# Patient Record
Sex: Female | Born: 1970
Health system: Southern US, Community
[De-identification: ages and names within clinical notes are randomized; demographics above are authoritative.]

## PROBLEM LIST (undated history)

## (undated) DIAGNOSIS — K5790 Diverticulosis of intestine, part unspecified, without perforation or abscess without bleeding: Secondary | ICD-10-CM

## (undated) DIAGNOSIS — K649 Unspecified hemorrhoids: Secondary | ICD-10-CM

## (undated) DIAGNOSIS — L719 Rosacea, unspecified: Secondary | ICD-10-CM

## (undated) DIAGNOSIS — E119 Type 2 diabetes mellitus without complications: Secondary | ICD-10-CM

## (undated) DIAGNOSIS — N200 Calculus of kidney: Secondary | ICD-10-CM

## (undated) HISTORY — DX: Calculus of kidney: N20.0

## (undated) HISTORY — PX: LITHOTRIPSY: SUR834

## (undated) HISTORY — PX: BLADDER SURGERY: SHX569

## (undated) HISTORY — DX: Type 2 diabetes mellitus without complications: E11.9

## (undated) HISTORY — DX: Diverticulosis of intestine, part unspecified, without perforation or abscess without bleeding: K57.90

## (undated) HISTORY — DX: Rosacea, unspecified: L71.9

## (undated) HISTORY — DX: Unspecified hemorrhoids: K64.9

---

## 2004-11-26 ENCOUNTER — Observation Stay: Payer: Self-pay | Admitting: Obstetrics and Gynecology

## 2004-12-21 HISTORY — PX: CHOLECYSTECTOMY: SHX55

## 2005-01-16 ENCOUNTER — Ambulatory Visit: Payer: Self-pay | Admitting: Obstetrics and Gynecology

## 2005-02-12 ENCOUNTER — Observation Stay: Payer: Self-pay

## 2005-02-15 ENCOUNTER — Inpatient Hospital Stay: Payer: Self-pay

## 2005-05-22 ENCOUNTER — Ambulatory Visit: Payer: Self-pay | Admitting: Family Medicine

## 2005-06-15 ENCOUNTER — Ambulatory Visit: Payer: Self-pay | Admitting: General Surgery

## 2008-09-04 ENCOUNTER — Ambulatory Visit: Payer: Self-pay | Admitting: General Practice

## 2012-05-31 ENCOUNTER — Ambulatory Visit: Payer: Self-pay | Admitting: Obstetrics and Gynecology

## 2013-07-14 ENCOUNTER — Ambulatory Visit: Payer: Self-pay | Admitting: Obstetrics and Gynecology

## 2014-08-09 ENCOUNTER — Ambulatory Visit: Payer: Self-pay

## 2015-09-05 ENCOUNTER — Other Ambulatory Visit: Payer: Self-pay | Admitting: Obstetrics and Gynecology

## 2015-09-05 DIAGNOSIS — Z1231 Encounter for screening mammogram for malignant neoplasm of breast: Secondary | ICD-10-CM

## 2015-09-06 ENCOUNTER — Ambulatory Visit
Admission: RE | Admit: 2015-09-06 | Discharge: 2015-09-06 | Disposition: A | Discharge status: Home / Self Care | Payer: BLUE CROSS/BLUE SHIELD | Source: Ambulatory Visit | Attending: Obstetrics and Gynecology | Admitting: Obstetrics and Gynecology

## 2015-09-06 DIAGNOSIS — Z1231 Encounter for screening mammogram for malignant neoplasm of breast: Secondary | ICD-10-CM | POA: Insufficient documentation

## 2015-09-09 ENCOUNTER — Other Ambulatory Visit: Payer: Self-pay | Admitting: Obstetrics and Gynecology

## 2015-09-09 DIAGNOSIS — R928 Other abnormal and inconclusive findings on diagnostic imaging of breast: Secondary | ICD-10-CM

## 2015-09-16 ENCOUNTER — Ambulatory Visit
Admission: RE | Admit: 2015-09-16 | Discharge: 2015-09-16 | Disposition: A | Discharge status: Home / Self Care | Payer: BLUE CROSS/BLUE SHIELD | Source: Ambulatory Visit | Attending: Obstetrics and Gynecology | Admitting: Obstetrics and Gynecology

## 2015-09-16 DIAGNOSIS — R928 Other abnormal and inconclusive findings on diagnostic imaging of breast: Secondary | ICD-10-CM

## 2015-09-16 DIAGNOSIS — N6002 Solitary cyst of left breast: Secondary | ICD-10-CM | POA: Diagnosis not present

## 2016-10-16 DIAGNOSIS — H5213 Myopia, bilateral: Secondary | ICD-10-CM | POA: Diagnosis not present

## 2016-11-06 ENCOUNTER — Other Ambulatory Visit: Payer: Self-pay | Admitting: Obstetrics and Gynecology

## 2016-11-06 DIAGNOSIS — Z1231 Encounter for screening mammogram for malignant neoplasm of breast: Secondary | ICD-10-CM

## 2016-11-06 DIAGNOSIS — Z01419 Encounter for gynecological examination (general) (routine) without abnormal findings: Secondary | ICD-10-CM | POA: Diagnosis not present

## 2016-11-06 DIAGNOSIS — K5730 Diverticulosis of large intestine without perforation or abscess without bleeding: Secondary | ICD-10-CM | POA: Diagnosis not present

## 2016-12-18 ENCOUNTER — Ambulatory Visit
Admission: RE | Admit: 2016-12-18 | Discharge: 2016-12-18 | Disposition: A | Discharge status: Home / Self Care | Payer: BLUE CROSS/BLUE SHIELD | Source: Ambulatory Visit | Attending: Obstetrics and Gynecology | Admitting: Obstetrics and Gynecology

## 2016-12-18 DIAGNOSIS — R928 Other abnormal and inconclusive findings on diagnostic imaging of breast: Secondary | ICD-10-CM | POA: Diagnosis not present

## 2016-12-18 DIAGNOSIS — Z1231 Encounter for screening mammogram for malignant neoplasm of breast: Secondary | ICD-10-CM | POA: Diagnosis not present

## 2016-12-25 ENCOUNTER — Other Ambulatory Visit: Payer: Self-pay | Admitting: Obstetrics and Gynecology

## 2016-12-25 DIAGNOSIS — N6320 Unspecified lump in the left breast, unspecified quadrant: Secondary | ICD-10-CM

## 2016-12-25 DIAGNOSIS — R928 Other abnormal and inconclusive findings on diagnostic imaging of breast: Secondary | ICD-10-CM

## 2017-01-12 ENCOUNTER — Encounter: Payer: Self-pay | Admitting: *Deleted

## 2017-01-26 ENCOUNTER — Encounter: Payer: Self-pay | Admitting: General Surgery

## 2017-01-26 ENCOUNTER — Ambulatory Visit (INDEPENDENT_AMBULATORY_CARE_PROVIDER_SITE_OTHER): Payer: BLUE CROSS/BLUE SHIELD | Admitting: General Surgery

## 2017-01-26 ENCOUNTER — Inpatient Hospital Stay: Payer: Self-pay

## 2017-01-26 VITALS — BP 142/84 | HR 74 | Resp 12 | Ht 62.0 in | Wt 170.0 lb

## 2017-01-26 DIAGNOSIS — N6320 Unspecified lump in the left breast, unspecified quadrant: Secondary | ICD-10-CM

## 2017-01-26 DIAGNOSIS — N6002 Solitary cyst of left breast: Secondary | ICD-10-CM | POA: Diagnosis not present

## 2017-01-26 NOTE — Patient Instructions (Signed)
The patient is aware to call back for any questions or concerns.  

## 2017-01-26 NOTE — Progress Notes (Signed)
Patient ID: Natasha Patel, female   DOB: 1971/01/23, 46 y.o.   MRN: [id number]  Chief Complaint  Patient presents with  . Breast Problem    HPI Triva Natasha Patel is a 46 y.o. female.  who presents for a breast evaluation referred by Dr Leafy Ro. The most recent mammogram was done on 12-22-16. She has been called back for added views as well as ultrasound exams multiple times over the years. Patient does perform regular self breast checks and gets regular mammograms done.  She can not feel anything different in the breast. Denies any breast injury or trauma. Denies any breast pain or discomfort.   HPI  Past Medical History:  Diagnosis Date  . Diverticulosis   . Hemorrhoid   . Kidney stones   . Rosacea     Past Surgical History:  Procedure Laterality Date  . BLADDER SURGERY     scope  . CHOLECYSTECTOMY  2006  . LITHOTRIPSY      Family History  Problem Relation Age of Onset  . Cancer Mother 92    bile duct cancer  . Heart attack Father   . Cancer Father 52    gall bladder  . Hodgkin's lymphoma Sister   . Thyroid disease Sister     Social History Social History  Substance Use Topics  . Smoking status: Former Smoker    Quit date: 12/21/1996  . Smokeless tobacco: Never Used  . Alcohol use Yes     Comment: occasionally    Allergies  Allergen Reactions  . Tetracyclines & Related Swelling    joints  . Amoxil [Amoxicillin] Rash  . Sulfa Antibiotics Rash    Current Outpatient Prescriptions  Medication Sig Dispense Refill  . Clindamycin Phos-Benzoyl Perox (ACANYA) gel Apply topically 2 (two) times daily.    Marland Kitchen ibuprofen (ADVIL,MOTRIN) 100 MG tablet Take 100 mg by mouth every 6 (six) hours as needed for fever.    . polyethylene glycol (MIRALAX / GLYCOLAX) packet Take 17 g by mouth daily as needed.     No current facility-administered medications for this visit.     Review of Systems Review of Systems  Constitutional: Negative.   Respiratory: Negative.    Cardiovascular: Negative.     Blood pressure (!) 142/84, pulse 74, resp. rate 12, height 5' 2"  (1.575 m), weight 170 lb (77.1 kg), last menstrual period 01/05/2017.  Physical Exam Physical Exam  Constitutional: She is oriented to person, place, and time. She appears well-developed and well-nourished.  HENT:  Mouth/Throat: Oropharynx is clear and moist.  Eyes: Conjunctivae are normal. No scleral icterus.  Neck: Neck supple.  Cardiovascular: Normal rate, regular rhythm and normal heart sounds.   Pulmonary/Chest: Effort normal and breath sounds normal. Right breast exhibits no inverted nipple, no mass, no nipple discharge, no skin change and no tenderness. Left breast exhibits no inverted nipple, no mass, no nipple discharge, no skin change and no tenderness.  Let breast > right breast 1/2 cup.  Lymphadenopathy:    She has no cervical adenopathy.    She has no axillary adenopathy.  Neurological: She is alert and oriented to person, place, and time.  Skin: Skin is warm and dry.  Psychiatric: Her behavior is normal.    Data Reviewed 09/06/2015 screening mammograms, 09/16/2015 diagnostic mammograms and left breast ultrasound as well as the December 18, 2016 bilateral screening exams were reviewed. Multiple densities in the upper-outer quadrant of left breast, on the September 2016 ultrasound these were all simple cyst.  Ultrasound  examination of the left breast was completed to confirm all the areas presently/ previously identified continued to represent simple cyst.   At the 4:00 position 9 cm from the nipple a softly lobulated anechoic mass measuring 1 x 1.28 x 1.5 cm with posterior acoustic enhancement was noted consistent with a simple cyst.  At the 2:00 position 7 cm she'll nipple a 0.86 x 0.93 x 1.49anechoic lesion with posterior acoustic enhancement and smooth borders was noted consistent with a simple cyst.   2 adjacent lesions were noted at the left breast at the 8:00 position  the largest measuring 0.57 cm in diameter, these were both anechoic with posterior acoustic enhancement consistent with a simple cyst. BIRAD-2.  The left breast ultrasound dated 09/16/2015 showed multiple cysts measuring up to 1.1 cm in size corresponding to the mammographic abnormalities.    Assessment    Benign breast exam, asymptomatic breast cysts.    Plan    Based on the results of today's clinical and ultrasound exam I do not believe any further imaging of the left breast is required.     Follow up as needed. The patient is aware to call back for any questions or concerns.   This information has been scribed by Karie Fetch RN, BSN,BC.  Natasha Patel 01/27/2017, 8:11 PM

## 2017-01-27 DIAGNOSIS — N6002 Solitary cyst of left breast: Secondary | ICD-10-CM | POA: Insufficient documentation

## 2017-01-28 DIAGNOSIS — H16141 Punctate keratitis, right eye: Secondary | ICD-10-CM | POA: Diagnosis not present

## 2017-02-05 DIAGNOSIS — D225 Melanocytic nevi of trunk: Secondary | ICD-10-CM | POA: Diagnosis not present

## 2017-08-13 DIAGNOSIS — D225 Melanocytic nevi of trunk: Secondary | ICD-10-CM | POA: Diagnosis not present

## 2017-08-13 DIAGNOSIS — L821 Other seborrheic keratosis: Secondary | ICD-10-CM | POA: Diagnosis not present

## 2017-08-27 ENCOUNTER — Encounter: Payer: Self-pay | Admitting: Physician Assistant

## 2017-08-27 ENCOUNTER — Ambulatory Visit
Admission: RE | Admit: 2017-08-27 | Discharge: 2017-08-27 | Disposition: A | Discharge status: Home / Self Care | Payer: BLUE CROSS/BLUE SHIELD | Source: Ambulatory Visit | Attending: Physician Assistant | Admitting: Physician Assistant

## 2017-08-27 ENCOUNTER — Ambulatory Visit (INDEPENDENT_AMBULATORY_CARE_PROVIDER_SITE_OTHER): Payer: BLUE CROSS/BLUE SHIELD | Admitting: Physician Assistant

## 2017-08-27 VITALS — BP 132/78 | HR 68 | Temp 98.6°F | Resp 16 | Wt 166.0 lb

## 2017-08-27 DIAGNOSIS — R198 Other specified symptoms and signs involving the digestive system and abdomen: Secondary | ICD-10-CM | POA: Diagnosis not present

## 2017-08-27 DIAGNOSIS — R160 Hepatomegaly, not elsewhere classified: Secondary | ICD-10-CM | POA: Insufficient documentation

## 2017-08-27 DIAGNOSIS — K760 Fatty (change of) liver, not elsewhere classified: Secondary | ICD-10-CM | POA: Diagnosis not present

## 2017-08-27 DIAGNOSIS — N200 Calculus of kidney: Secondary | ICD-10-CM | POA: Insufficient documentation

## 2017-08-27 DIAGNOSIS — R1032 Left lower quadrant pain: Secondary | ICD-10-CM | POA: Diagnosis not present

## 2017-08-27 DIAGNOSIS — K429 Umbilical hernia without obstruction or gangrene: Secondary | ICD-10-CM | POA: Insufficient documentation

## 2017-08-27 DIAGNOSIS — N3090 Cystitis, unspecified without hematuria: Secondary | ICD-10-CM | POA: Diagnosis not present

## 2017-08-27 DIAGNOSIS — R197 Diarrhea, unspecified: Secondary | ICD-10-CM

## 2017-08-27 DIAGNOSIS — R319 Hematuria, unspecified: Secondary | ICD-10-CM | POA: Diagnosis not present

## 2017-08-27 DIAGNOSIS — Z9049 Acquired absence of other specified parts of digestive tract: Secondary | ICD-10-CM | POA: Insufficient documentation

## 2017-08-27 DIAGNOSIS — R109 Unspecified abdominal pain: Secondary | ICD-10-CM | POA: Diagnosis not present

## 2017-08-27 LAB — BASIC METABOLIC PANEL WITH GFR
BUN: 10 mg/dL (ref 7–25)
CO2: 25 mmol/L (ref 20–32)
CREATININE: 0.7 mg/dL (ref 0.50–1.10)
Calcium: 9.2 mg/dL (ref 8.6–10.2)
Chloride: 106 mmol/L (ref 98–110)
GFR, EST AFRICAN AMERICAN: 121 mL/min/{1.73_m2} (ref >=60)
GFR, EST NON AFRICAN AMERICAN: 105 mL/min/{1.73_m2} (ref >=60)
Glucose, Bld: 89 mg/dL (ref 65–99)
POTASSIUM: 4.1 mmol/L (ref 3.5–5.3)
SODIUM: 139 mmol/L (ref 135–146)

## 2017-08-27 LAB — CBC WITH DIFFERENTIAL/PLATELET
BASOS PCT: 0.6 %
Basophils Absolute: 43 cells/uL (ref 0–200)
EOS PCT: 1.7 %
Eosinophils Absolute: 122 cells/uL (ref 15–500)
HCT: 36.9 % (ref 35.0–45.0)
Hemoglobin: 12.6 g/dL (ref 11.7–15.5)
Lymphs Abs: 2174 cells/uL (ref 850–3900)
MCH: 28.6 pg (ref 27.0–33.0)
MCHC: 34.1 g/dL (ref 32.0–36.0)
MCV: 83.7 fL (ref 80.0–100.0)
MPV: 9.6 fL (ref 7.5–12.5)
Monocytes Relative: 7.2 %
NEUTROS ABS: 4342 {cells}/uL (ref 1500–7800)
Neutrophils Relative %: 60.3 %
PLATELETS: 311 10*3/uL (ref 140–400)
RBC: 4.41 10*6/uL (ref 3.80–5.10)
RDW: 14.1 % (ref 11.0–15.0)
Total Lymphocyte: 30.2 %
WBC mixed population: 518 cells/uL (ref 200–950)
WBC: 7.2 10*3/uL (ref 3.8–10.8)

## 2017-08-27 MED ORDER — IOPAMIDOL (ISOVUE-300) INJECTION 61%
100.0000 mL | Freq: Once | INTRAVENOUS | Status: AC | PRN
  Administered 2017-08-27: 100 mL via INTRAVENOUS

## 2017-08-27 NOTE — Progress Notes (Addendum)
Patient: Natasha Patel Female    DOB: Sep 04, 1971   46 y.o.   MRN: [id number] Visit Date: 08/27/2017  Today's Provider: Mar Daring, PA-C   Chief Complaint  Patient presents with  . Abdominal Pain   Subjective:    Abdominal Pain  This is a new problem. The current episode started in the past 7 days. The problem occurs constantly. The problem has been gradually worsening. The pain is located in the LUQ. The pain is at a severity of 5/10. The quality of the pain is aching and dull. The abdominal pain radiates to the back. Associated symptoms include anorexia and nausea. Pertinent negatives include no constipation, diarrhea, dysuria, frequency, headaches, hematuria, vomiting or weight loss. Nothing aggravates the pain. The pain is relieved by nothing. Treatments tried: Ibuprofen. The treatment provided no relief. History of kidney stones   Patient does have history of kidney stones, last one in early 75s. States it is not as severe as when she had kidney stones.  Does have history of diverticulosis without diverticulitis. No change in diet recently. Does report having diarrhea once or twice since pain started. No hematochezia or melena. No vomiting. No fever.   Could possibly be close to menstrual cycle, patient unsure as they have been irregular more recently. No personal or family history of ovarian cysts, fibroids, endometriosis or female reproductive malignancy.     Allergies  Allergen Reactions  . Tetracyclines & Related Swelling    joints  . Amoxil [Amoxicillin] Rash  . Sulfa Antibiotics Rash     Current Outpatient Prescriptions:  .  Clindamycin Phos-Benzoyl Perox (ACANYA) gel, Apply topically every evening., Disp: , Rfl:  .  ibuprofen (ADVIL,MOTRIN) 100 MG tablet, Take 100 mg by mouth every 6 (six) hours as needed for fever., Disp: , Rfl:   Review of Systems  Constitutional: Negative.  Negative for weight loss.  Cardiovascular: Negative.   Gastrointestinal:  Positive for abdominal pain, anorexia and nausea. Negative for abdominal distention, anal bleeding, blood in stool, constipation, diarrhea, rectal pain and vomiting.  Genitourinary: Negative for difficulty urinating, dysuria, frequency and hematuria.  Musculoskeletal: Positive for back pain.  Neurological: Negative for dizziness, light-headedness and headaches.    Social History  Substance Use Topics  . Smoking status: Former Smoker    Quit date: 12/21/1996  . Smokeless tobacco: Never Used  . Alcohol use Yes     Comment: occasionally   Objective:   BP 132/78 (BP Location: Left Arm, Patient Position: Sitting, Cuff Size: Normal)   Pulse 68   Temp 98.6 F (37 C) (Oral)   Resp 16   Wt 166 lb (75.3 kg)   BMI 30.36 kg/m  Vitals:   08/27/17 0951  BP: 132/78  Pulse: 68  Resp: 16  Temp: 98.6 F (37 C)  TempSrc: Oral  Weight: 166 lb (75.3 kg)     Physical Exam  Constitutional: She is oriented to person, place, and time. She appears well-developed and well-nourished. No distress.  Cardiovascular: Normal rate, regular rhythm and normal heart sounds.  Exam reveals no gallop and no friction rub.   No murmur heard. Pulmonary/Chest: Effort normal and breath sounds normal. No respiratory distress. She has no wheezes. She has no rales.  Abdominal: Soft. Normal appearance and bowel sounds are normal. She exhibits no distension and no mass. There is no hepatosplenomegaly. There is tenderness in the left lower quadrant. There is guarding. There is no rebound and no CVA tenderness. No hernia.  Suprapubic tenderness  Neurological: She is alert and oriented to person, place, and time.  Skin: Skin is warm and dry. She is not diaphoretic.  Vitals reviewed.       Assessment & Plan:     1. Diarrhea, unspecified type DDx: diverticulitis, ovarian cyst, ureteral stone, gastritis, muscle strain, hernia.  Will check labs as below and CT ordered. I will f/u and treat pending CT results. Addendum to  follow once results received.  - Basic Metabolic Panel (BMET) - CBC w/Diff/Platelet - CT Abdomen Pelvis W Contrast; Future  2. Left lower quadrant pain See above medical treatment plan. - Basic Metabolic Panel (BMET) - CBC w/Diff/Platelet - CT Abdomen Pelvis W Contrast; Future  3. Hematuria, unspecified type Will send for microscopy and culture since small hematuria noted on UA today.  - Urinalysis, microscopic only - Urine Culture  4. Left lower quadrant guarding See above medical treatment plan. - Basic Metabolic Panel (BMET) - CBC w/Diff/Platelet - CT Abdomen Pelvis W Contrast; Future  Addend: CLINICAL DATA:  Abdominal pain radiating into the back in patient with a history of diverticulitis.  EXAM: CT ABDOMEN AND PELVIS WITH CONTRAST  TECHNIQUE: Multidetector CT imaging of the abdomen and pelvis was performed using the standard protocol following bolus administration of intravenous contrast.  CONTRAST:  100 ml ISOVUE-300 IOPAMIDOL (ISOVUE-300) INJECTION 61%  COMPARISON:  None.  FINDINGS: Lower chest: Lung bases are clear. No pleural or pericardial effusion.  Hepatobiliary: The patient is status post cholecystectomy. Biliary tree is unremarkable. Diffuse fatty infiltration of the liver without focal liver lesion. The liver measures 21 cm craniocaudal.  Pancreas: Unremarkable. No pancreatic ductal dilatation or surrounding inflammatory changes.  Spleen: Normal in size without focal abnormality.  Adrenals/Urinary Tract: The adrenal glands appear normal. Tiny cyst in the upper pole of the left kidney is noted. The kidneys are otherwise unremarkable. Ureters and urinary bladder are normal.  Stomach/Bowel: Stomach is within normal limits. Appendix appears normal. No evidence of bowel wall thickening, distention or inflammatory changes.  Vascular/Lymphatic: No significant vascular findings are present. No enlarged abdominal or pelvic lymph  nodes.  Reproductive: Uterus and bilateral adnexa are unremarkable.  Other: Small fat containing umbilical hernia is identified. No fluid collection.  Musculoskeletal: Negative.  IMPRESSION: Negative for diverticulitis. No acute abnormality abdomen or pelvis.  Hepatomegaly and fatty infiltration of the liver.  Small fat containing umbilical hernia.  Status post cholecystectomy.   Electronically Signed   By: Inge Rise M.D.   On: 08/27/2017 14:27  Plan: CT was unremarkable. Urine microscopy did have bacteria present however. Will treat as cystitis with muscle strain of low back from moving. I will send in [place] for UTI. Urine culture is still pending. IBU or aleve can be used for back strain.       Mar Daring, PA-C  [facility]

## 2017-08-28 LAB — URINALYSIS, MICROSCOPIC ONLY
Hyaline Cast: NONE SEEN /LPF
RBC / HPF: NONE SEEN /HPF (ref 0–2)
WBC, UA: NONE SEEN /HPF (ref 0–5)

## 2017-08-28 LAB — URINE CULTURE
MICRO NUMBER: 80986209
SPECIMEN QUALITY:: ADEQUATE

## 2017-08-28 MED ORDER — NITROFURANTOIN MONOHYD MACRO 100 MG PO CAPS: 100.0000 mg | ORAL_CAPSULE | Freq: Two times a day (BID) | ORAL | 0 refills | Status: DC

## 2017-08-28 NOTE — Addendum Note (Signed)
Addended by: Mar Daring on: 08/28/2017 08:44 AM   Modules accepted: Orders

## 2017-08-30 ENCOUNTER — Telehealth: Payer: Self-pay

## 2017-08-30 ENCOUNTER — Ambulatory Visit: Admission: RE | Admit: 2017-08-30 | Payer: BLUE CROSS/BLUE SHIELD | Source: Ambulatory Visit

## 2017-08-30 NOTE — Telephone Encounter (Signed)
-----   Message from Mar Daring, PA-C sent at 08/30/2017  8:33 AM EDT ----- Urine culture was not a clean catch. Please let us know if symptoms are improving on the antibiotic yet. If so please continue until completed.

## 2017-08-30 NOTE — Telephone Encounter (Signed)
Since her CT was normal I do not think anything is wrong with her reproductive system. What I would recommend next would be to try a medication for her stomach, such as Prilosec OTC x 2 weeks to see if any changes. If still no improvements after prilosec then can refer to GI.

## 2017-08-30 NOTE — Telephone Encounter (Signed)
Patient advised as directed below.  Thanks,  -Debra Calabretta 

## 2017-08-30 NOTE — Telephone Encounter (Signed)
-----   Message from Mar Daring, [place] sent at 08/28/2017  8:32 AM EDT ----- Please notify patient that CT is completely normal. No diverticulitis, no ovarian abnormality, no kidney stone. There is some fatty liver noted, but this is secondary to dietary habits. Cut fatty foods from diet and work on healthy lifestyle and this can improve. However, her urine does show some bacteria. It is possible she may have a UTI causing her discomfort. I will send in an antibiotic for her to take while we await culture. Her labs were all normal as well. She can also use IBU or aleve for her back pain as I feel this is muscular.

## 2017-08-30 NOTE — Telephone Encounter (Signed)
Patient was advised as directed below. Per patient she still has the pain LUQ and wants to know what is the next plan? Does she needs to be refer somewhere else? Maybe to GYN? Please review.  Thanks,  -Tasia Liz

## 2017-08-30 NOTE — Telephone Encounter (Signed)
Patient advised as below. Patient verbalizes understanding and is in agreement with treatment plan.  

## 2017-10-22 DIAGNOSIS — H5213 Myopia, bilateral: Secondary | ICD-10-CM | POA: Diagnosis not present

## 2017-12-23 DIAGNOSIS — Z1231 Encounter for screening mammogram for malignant neoplasm of breast: Secondary | ICD-10-CM | POA: Diagnosis not present

## 2017-12-23 DIAGNOSIS — Z0000 Encounter for general adult medical examination without abnormal findings: Secondary | ICD-10-CM | POA: Diagnosis not present

## 2017-12-23 DIAGNOSIS — N3281 Overactive bladder: Secondary | ICD-10-CM | POA: Diagnosis not present

## 2017-12-31 DIAGNOSIS — Z0000 Encounter for general adult medical examination without abnormal findings: Secondary | ICD-10-CM | POA: Diagnosis not present

## 2018-01-14 ENCOUNTER — Ambulatory Visit: Payer: BLUE CROSS/BLUE SHIELD

## 2018-02-04 ENCOUNTER — Encounter: Payer: Self-pay | Admitting: *Deleted

## 2018-02-04 ENCOUNTER — Encounter: Payer: BLUE CROSS/BLUE SHIELD | Attending: Certified Nurse Midwife | Admitting: *Deleted

## 2018-02-04 VITALS — BP 152/98 | Ht 62.0 in | Wt 165.2 lb

## 2018-02-04 DIAGNOSIS — E119 Type 2 diabetes mellitus without complications: Secondary | ICD-10-CM | POA: Insufficient documentation

## 2018-02-04 DIAGNOSIS — Z713 Dietary counseling and surveillance: Secondary | ICD-10-CM | POA: Diagnosis not present

## 2018-02-04 DIAGNOSIS — D225 Melanocytic nevi of trunk: Secondary | ICD-10-CM | POA: Diagnosis not present

## 2018-02-04 NOTE — Patient Instructions (Signed)
Check blood sugars 2 x day before breakfast and 2 hrs after one meal 3 x week Bring blood sugar records to the next class  Call your doctor for a prescription for:  1. Meter strips (type) Contour Next  checking  3 times per week  2. Lancets (type) Contour Microlet checking  3     times per week  Exercise: Continue routine  for  45-50  minutes  3  days a week  Eat 3 meals day,  1-2  snacks a day Space meals 4-6 hours apart Don't skip meals Limit desserts/sweets Avoid sugar sweetened drinks (tea, coffee)    Return for classes on:

## 2018-02-04 NOTE — Progress Notes (Signed)
Diabetes Self-Management Education  Visit Type: First/Initial  Appt. Start Time: 1125 Appt. End Time: 1240  02/04/2018  [name], identified by name and date of birth, is a 47 y.o. female with a diagnosis of Diabetes: Type 2.   ASSESSMENT  Blood pressure (!) 160/90 - recheck of 152/98, height 5' 2"  (1.575 m), weight 165 lb 3.2 oz (74.9 kg). Body mass index is 30.22 kg/m.  Diabetes Self-Management Education - 02/04/18 1438      Visit Information   Visit Type  First/Initial      Initial Visit   Diabetes Type  Type 2    Are you currently following a meal plan?  Yes    What type of meal plan do you follow?  "cutting back on carbs"    Are you taking your medications as prescribed?  Yes    Date Diagnosed  last month      Health Coping   How would you rate your overall health?  Excellent      Psychosocial Assessment   Patient Belief/Attitude about Diabetes  Motivated to manage diabetes "nervous"    Self-care barriers  None    Self-management support  Doctor's office;Friends;Family    Patient Concerns  Nutrition/Meal planning;Healthy Lifestyle;Weight Control    Special Needs  None    Preferred Learning Style  Visual    Learning Readiness  Change in progress    How often do you need to have someone help you when you read instructions, pamphlets, or other written materials from your doctor or pharmacy?  1 - Never    What is the last grade level you completed in school?  college      Pre-Education Assessment   Patient understands the diabetes disease and treatment process.  Needs Instruction    Patient understands incorporating nutritional management into lifestyle.  Needs Instruction    Patient undertands incorporating physical activity into lifestyle.  Needs Review    Patient understands using medications safely.  Needs Instruction    Patient understands monitoring blood glucose, interpreting and using results  Needs Instruction    Patient understands prevention,  detection, and treatment of acute complications.  Needs Instruction    Patient understands prevention, detection, and treatment of chronic complications.  Needs Instruction    Patient understands how to develop strategies to address psychosocial issues.  Needs Instruction    Patient understands how to develop strategies to promote health/change behavior.  Needs Instruction      Complications   Last HgB A1C per patient/outside source  6.6 % 12/31/17    How often do you check your blood sugar?  0 times/day (not testing) Provided Contour Next One meter and instructed on use. BG upon return demonstration was 97 mg/dL at 12:20 pm - 5 1/2 hrs afer having coffee with little sugar.     Have you had a dilated eye exam in the past 12 months?  Yes    Have you had a dental exam in the past 12 months?  Yes    Are you checking your feet?  No      Dietary Intake   Breakfast  protein shake or cereal and milk    Snack (morning)  cheese and meat or yogurt    Lunch  chicken salad or chili with salad or meat and veggies    Snack (afternoon)  chocolate    Dinner  sometimes skips -  pizza, [place], chicken, green beans, mac-n-cheese, brown rice, quinoa, pinto beans, cold peas, lima  beans    Beverage(s)  water, 1/2 and 1/2 sweet tea, coffee with sugar, diet soda      Exercise   Exercise Type  Light (walking / raking leaves) walking and yoga    How many days per week to you exercise?  3    How many minutes per day do you exercise?  50    Total minutes per week of exercise  150      Patient Education   Previous Diabetes Education  Yes (please comment) 13 years ago when she had gestational diabetes    Disease state   Definition of diabetes, type 1 and 2, and the diagnosis of diabetes;Factors that contribute to the development of diabetes    Nutrition management   Role of diet in the treatment of diabetes and the relationship between the three main macronutrients and blood glucose level;Reviewed blood glucose goals  for pre and post meals and how to evaluate the patients' food intake on their blood glucose level.    Physical activity and exercise   Role of exercise on diabetes management, blood pressure control and cardiac health.    Monitoring  Taught/evaluated SMBG meter.;Purpose and frequency of SMBG.;Taught/discussed recording of test results and interpretation of SMBG.;Identified appropriate SMBG and/or A1C goals.    Chronic complications  Relationship between chronic complications and blood glucose control    Psychosocial adjustment  Identified and addressed patients feelings and concerns about diabetes      Individualized Goals (developed by patient)   Reducing Risk Lose weight Lead a healthier lifestyle     Outcomes   Expected Outcomes  Demonstrated interest in learning. Expect positive outcomes    Future DMSE  2 months       Individualized Plan for Diabetes Self-Management Training:   Learning Objective:  Patient will have a greater understanding of diabetes self-management. Patient education plan is to attend individual and/or group sessions per assessed needs and concerns.   Plan:   Patient Instructions  Check blood sugars 2 x day before breakfast and 2 hrs after one meal 3 x week Bring blood sugar records to the next class Call your doctor for a prescription for:  1. Meter strips (type) Contour Next  checking  3 times per week  2. Lancets (type) Contour Microlet checking  3     times per week Exercise: Continue routine  for  45-50  minutes  3  days a week Eat 3 meals day,  1-2  snacks a day Space meals 4-6 hours apart Don't skip meals Limit desserts/sweets Avoid sugar sweetened drinks (tea, coffee)    Expected Outcomes:  Demonstrated interest in learning. Expect positive outcomes  Education material provided:  General Meal Planning Guidelines Simple Meal Plan Meter = Contour Next One  If problems or questions, patient to contact team via:  Johny Drilling, [place], [place], CDE  [phone]  Future DSME appointment: 2 months  April 11, 2018 for Diabetes Class 1

## 2018-04-11 ENCOUNTER — Ambulatory Visit: Payer: BLUE CROSS/BLUE SHIELD

## 2018-04-18 ENCOUNTER — Ambulatory Visit: Payer: BLUE CROSS/BLUE SHIELD

## 2018-04-20 ENCOUNTER — Telehealth: Payer: Self-pay | Admitting: *Deleted

## 2018-04-20 ENCOUNTER — Other Ambulatory Visit: Payer: Self-pay | Admitting: Certified Nurse Midwife

## 2018-04-20 DIAGNOSIS — N6002 Solitary cyst of left breast: Secondary | ICD-10-CM

## 2018-04-20 DIAGNOSIS — Z1239 Encounter for other screening for malignant neoplasm of breast: Secondary | ICD-10-CM

## 2018-04-20 NOTE — Telephone Encounter (Signed)
Patient called and was last seen by Dr.Byrnett 01/2017 for her left breast. She stated that she called [facility] to schedule a mammogram but since she has a u/s and problems in the past that she needs to get a physician to order that. She called Dr.Beasleys office to get them to order her mammogram and they told he she needs to call here to get Dr.Byrnett to order it since he did her u/s. Patient just wants to get her mammogram, so can we order this for her?

## 2018-04-21 NOTE — Telephone Encounter (Signed)
Spoke with patient and her GYN doctor has placed her mammogram orders in Epic. She is now scheduled with [facility] for her mammogram.

## 2018-04-22 DIAGNOSIS — E119 Type 2 diabetes mellitus without complications: Secondary | ICD-10-CM | POA: Diagnosis not present

## 2018-04-22 DIAGNOSIS — E7800 Pure hypercholesterolemia, unspecified: Secondary | ICD-10-CM | POA: Diagnosis not present

## 2018-04-22 DIAGNOSIS — K219 Gastro-esophageal reflux disease without esophagitis: Secondary | ICD-10-CM | POA: Diagnosis not present

## 2018-04-22 DIAGNOSIS — I10 Essential (primary) hypertension: Secondary | ICD-10-CM | POA: Diagnosis not present

## 2018-04-25 ENCOUNTER — Ambulatory Visit: Payer: BLUE CROSS/BLUE SHIELD

## 2018-05-02 ENCOUNTER — Encounter: Payer: Self-pay | Admitting: Dietician

## 2018-05-02 ENCOUNTER — Encounter: Payer: BLUE CROSS/BLUE SHIELD | Attending: Certified Nurse Midwife | Admitting: Dietician

## 2018-05-02 VITALS — Ht 62.0 in | Wt 164.1 lb

## 2018-05-02 DIAGNOSIS — E119 Type 2 diabetes mellitus without complications: Secondary | ICD-10-CM | POA: Diagnosis not present

## 2018-05-02 DIAGNOSIS — Z713 Dietary counseling and surveillance: Secondary | ICD-10-CM | POA: Diagnosis not present

## 2018-05-02 NOTE — Progress Notes (Signed)

## 2018-05-08 DIAGNOSIS — I10 Essential (primary) hypertension: Secondary | ICD-10-CM | POA: Insufficient documentation

## 2018-05-08 DIAGNOSIS — K219 Gastro-esophageal reflux disease without esophagitis: Secondary | ICD-10-CM | POA: Insufficient documentation

## 2018-05-08 DIAGNOSIS — E7800 Pure hypercholesterolemia, unspecified: Secondary | ICD-10-CM | POA: Insufficient documentation

## 2018-05-08 DIAGNOSIS — E119 Type 2 diabetes mellitus without complications: Secondary | ICD-10-CM | POA: Insufficient documentation

## 2018-05-09 ENCOUNTER — Encounter: Payer: BLUE CROSS/BLUE SHIELD | Admitting: Dietician

## 2018-05-09 VITALS — Wt 163.7 lb

## 2018-05-09 DIAGNOSIS — E119 Type 2 diabetes mellitus without complications: Secondary | ICD-10-CM | POA: Diagnosis not present

## 2018-05-09 DIAGNOSIS — Z713 Dietary counseling and surveillance: Secondary | ICD-10-CM | POA: Diagnosis not present

## 2018-05-09 NOTE — Progress Notes (Signed)

## 2018-05-20 ENCOUNTER — Other Ambulatory Visit: Payer: BLUE CROSS/BLUE SHIELD

## 2018-05-23 ENCOUNTER — Encounter: Payer: BLUE CROSS/BLUE SHIELD | Attending: Certified Nurse Midwife | Admitting: Dietician

## 2018-05-23 ENCOUNTER — Encounter: Payer: Self-pay | Admitting: Dietician

## 2018-05-23 VITALS — BP 124/82 | Ht 62.0 in | Wt 162.5 lb

## 2018-05-23 DIAGNOSIS — E119 Type 2 diabetes mellitus without complications: Secondary | ICD-10-CM | POA: Diagnosis not present

## 2018-05-23 DIAGNOSIS — Z713 Dietary counseling and surveillance: Secondary | ICD-10-CM | POA: Diagnosis not present

## 2018-05-23 NOTE — Progress Notes (Signed)
Appt. Start Time: 1730 Appt. End Time: 1930  Class 3 Diabetes Overview - identify functions of pancreas and insulin; define insulin deficiency vs insulin resistance  Medications - state name, dose, timing of currently prescribed medications; describe types of medications available for diabetes  Psychosocial - identify DM as a source of stress; state the effects of stress on BG control; verbalize appropriate stress management techniques; identify personal stress issues   Nutritional Management - use food labels to identify serving size, content of carbohydrate, fiber, protein, fat, saturated fat and sodium; recognize food sources of fat, saturated fat, trans fat, and sodium, and verbalize goals for intake; describe healthful, appropriate food choices when dining out   Exercise - state a plan for personal exercise; verbalize contraindications for exercise  Self-Monitoring - state importance of SMBG; use SMBG results to effectively manage diabetes; identify importance of regular HbA1C testing and goals for results  Acute Complications - recognize hyperglycemia and hypoglycemia with causes and effects; identify blood glucose results as high, low or in control; list steps in treating and preventing high and low blood glucose  Chronic Complications - state importance of daily self-foot exams; explain appropriate eye and dental care  Lifestyle Changes/Goals & Health/Community Resources - set goals for proper diabetes care; state need for and frequency of healthcare follow-up; describe appropriate community resources for good health (ADA, web sites, apps)   Teaching Materials Used: Class 3 Slide Packet Diabetes Stress Test Stress Management Tools Stress Poem Goal Setting Worksheet Website/App List

## 2018-05-30 ENCOUNTER — Encounter: Payer: Self-pay | Admitting: *Deleted

## 2018-06-03 ENCOUNTER — Ambulatory Visit
Admission: RE | Admit: 2018-06-03 | Discharge: 2018-06-03 | Disposition: A | Discharge status: Home / Self Care | Payer: BLUE CROSS/BLUE SHIELD | Source: Ambulatory Visit | Attending: Certified Nurse Midwife | Admitting: Certified Nurse Midwife

## 2018-06-03 DIAGNOSIS — R928 Other abnormal and inconclusive findings on diagnostic imaging of breast: Secondary | ICD-10-CM | POA: Diagnosis not present

## 2018-06-03 DIAGNOSIS — Z1239 Encounter for other screening for malignant neoplasm of breast: Secondary | ICD-10-CM

## 2018-06-03 DIAGNOSIS — N6012 Diffuse cystic mastopathy of left breast: Secondary | ICD-10-CM | POA: Diagnosis not present

## 2018-06-03 DIAGNOSIS — Z1231 Encounter for screening mammogram for malignant neoplasm of breast: Secondary | ICD-10-CM | POA: Diagnosis not present

## 2018-06-03 DIAGNOSIS — N6002 Solitary cyst of left breast: Secondary | ICD-10-CM

## 2018-08-05 DIAGNOSIS — E119 Type 2 diabetes mellitus without complications: Secondary | ICD-10-CM | POA: Diagnosis not present

## 2018-08-05 DIAGNOSIS — K219 Gastro-esophageal reflux disease without esophagitis: Secondary | ICD-10-CM | POA: Diagnosis not present

## 2018-08-05 DIAGNOSIS — I10 Essential (primary) hypertension: Secondary | ICD-10-CM | POA: Diagnosis not present

## 2018-08-30 DIAGNOSIS — S39012A Strain of muscle, fascia and tendon of lower back, initial encounter: Secondary | ICD-10-CM | POA: Diagnosis not present

## 2018-11-11 DIAGNOSIS — H52223 Regular astigmatism, bilateral: Secondary | ICD-10-CM | POA: Diagnosis not present

## 2018-11-11 DIAGNOSIS — H5213 Myopia, bilateral: Secondary | ICD-10-CM | POA: Diagnosis not present

## 2019-01-04 ENCOUNTER — Encounter: Payer: Self-pay | Admitting: *Deleted

## 2019-01-27 DIAGNOSIS — E119 Type 2 diabetes mellitus without complications: Secondary | ICD-10-CM | POA: Diagnosis not present

## 2019-02-03 DIAGNOSIS — D2262 Melanocytic nevi of left upper limb, including shoulder: Secondary | ICD-10-CM | POA: Diagnosis not present

## 2019-02-03 DIAGNOSIS — D2261 Melanocytic nevi of right upper limb, including shoulder: Secondary | ICD-10-CM | POA: Diagnosis not present

## 2019-02-03 DIAGNOSIS — L700 Acne vulgaris: Secondary | ICD-10-CM | POA: Diagnosis not present

## 2019-02-03 DIAGNOSIS — D225 Melanocytic nevi of trunk: Secondary | ICD-10-CM | POA: Diagnosis not present

## 2019-02-10 DIAGNOSIS — E119 Type 2 diabetes mellitus without complications: Secondary | ICD-10-CM | POA: Diagnosis not present

## 2019-02-10 DIAGNOSIS — I10 Essential (primary) hypertension: Secondary | ICD-10-CM | POA: Diagnosis not present

## 2019-02-10 DIAGNOSIS — Z0000 Encounter for general adult medical examination without abnormal findings: Secondary | ICD-10-CM | POA: Diagnosis not present

## 2019-05-19 DIAGNOSIS — L700 Acne vulgaris: Secondary | ICD-10-CM | POA: Diagnosis not present

## 2019-05-22 ENCOUNTER — Other Ambulatory Visit: Payer: Self-pay | Admitting: Certified Nurse Midwife

## 2019-05-22 DIAGNOSIS — Z1231 Encounter for screening mammogram for malignant neoplasm of breast: Secondary | ICD-10-CM

## 2019-05-26 DIAGNOSIS — L700 Acne vulgaris: Secondary | ICD-10-CM | POA: Diagnosis not present

## 2019-06-30 ENCOUNTER — Ambulatory Visit
Admission: RE | Admit: 2019-06-30 | Discharge: 2019-06-30 | Disposition: A | Discharge status: Home / Self Care | Payer: 59 | Source: Ambulatory Visit | Attending: Certified Nurse Midwife | Admitting: Certified Nurse Midwife

## 2019-06-30 ENCOUNTER — Other Ambulatory Visit: Payer: Self-pay

## 2019-06-30 DIAGNOSIS — Z1231 Encounter for screening mammogram for malignant neoplasm of breast: Secondary | ICD-10-CM | POA: Insufficient documentation

## 2019-07-05 ENCOUNTER — Other Ambulatory Visit: Payer: Self-pay | Admitting: Certified Nurse Midwife

## 2019-07-05 DIAGNOSIS — R928 Other abnormal and inconclusive findings on diagnostic imaging of breast: Secondary | ICD-10-CM

## 2019-07-05 DIAGNOSIS — N6320 Unspecified lump in the left breast, unspecified quadrant: Secondary | ICD-10-CM

## 2019-07-07 ENCOUNTER — Ambulatory Visit
Admission: RE | Admit: 2019-07-07 | Discharge: 2019-07-07 | Disposition: A | Discharge status: Home / Self Care | Payer: 59 | Source: Ambulatory Visit | Attending: Certified Nurse Midwife | Admitting: Certified Nurse Midwife

## 2019-07-07 ENCOUNTER — Other Ambulatory Visit: Payer: Self-pay

## 2019-07-07 DIAGNOSIS — R928 Other abnormal and inconclusive findings on diagnostic imaging of breast: Secondary | ICD-10-CM

## 2019-07-07 DIAGNOSIS — N6320 Unspecified lump in the left breast, unspecified quadrant: Secondary | ICD-10-CM | POA: Insufficient documentation

## 2020-07-26 ENCOUNTER — Other Ambulatory Visit: Payer: Self-pay

## 2020-07-26 DIAGNOSIS — Z1152 Encounter for screening for COVID-19: Secondary | ICD-10-CM

## 2020-07-27 LAB — SARS-COV-2, NAA 2 DAY TAT

## 2020-07-27 LAB — NOVEL CORONAVIRUS, NAA: SARS-CoV-2, NAA: NOT DETECTED

## 2020-12-26 ENCOUNTER — Other Ambulatory Visit: Payer: Self-pay | Admitting: Obstetrics and Gynecology

## 2020-12-26 DIAGNOSIS — Z1231 Encounter for screening mammogram for malignant neoplasm of breast: Secondary | ICD-10-CM

## 2021-01-17 ENCOUNTER — Ambulatory Visit
Admission: RE | Admit: 2021-01-17 | Discharge: 2021-01-17 | Disposition: A | Discharge status: Home / Self Care | Payer: 59 | Source: Ambulatory Visit | Attending: Obstetrics and Gynecology | Admitting: Obstetrics and Gynecology

## 2021-01-17 ENCOUNTER — Other Ambulatory Visit: Payer: Self-pay

## 2021-01-17 DIAGNOSIS — Z1231 Encounter for screening mammogram for malignant neoplasm of breast: Secondary | ICD-10-CM | POA: Diagnosis present

## 2021-10-27 ENCOUNTER — Other Ambulatory Visit: Payer: Self-pay

## 2021-10-30 LAB — COLOGUARD: COLOGUARD: NEGATIVE

## 2021-10-30 LAB — EXTERNAL GENERIC LAB PROCEDURE: COLOGUARD: NEGATIVE

## 2022-01-05 ENCOUNTER — Other Ambulatory Visit: Payer: Self-pay | Admitting: Internal Medicine

## 2022-01-05 DIAGNOSIS — Z1231 Encounter for screening mammogram for malignant neoplasm of breast: Secondary | ICD-10-CM

## 2022-02-06 ENCOUNTER — Other Ambulatory Visit: Payer: Self-pay

## 2022-02-06 ENCOUNTER — Ambulatory Visit
Admission: RE | Admit: 2022-02-06 | Discharge: 2022-02-06 | Disposition: A | Discharge status: Home / Self Care | Payer: 59 | Source: Ambulatory Visit | Attending: Internal Medicine | Admitting: Internal Medicine

## 2022-02-06 DIAGNOSIS — Z1231 Encounter for screening mammogram for malignant neoplasm of breast: Secondary | ICD-10-CM | POA: Diagnosis not present

## 2023-01-28 ENCOUNTER — Other Ambulatory Visit: Payer: Self-pay | Admitting: Obstetrics and Gynecology

## 2023-01-28 DIAGNOSIS — Z1231 Encounter for screening mammogram for malignant neoplasm of breast: Secondary | ICD-10-CM

## 2023-02-19 ENCOUNTER — Ambulatory Visit
Admission: RE | Admit: 2023-02-19 | Discharge: 2023-02-19 | Disposition: A | Discharge status: Home / Self Care | Payer: 59 | Source: Ambulatory Visit | Attending: Obstetrics and Gynecology | Admitting: Obstetrics and Gynecology

## 2023-02-19 ENCOUNTER — Encounter: Payer: Self-pay | Admitting: Radiology

## 2023-02-19 DIAGNOSIS — Z1231 Encounter for screening mammogram for malignant neoplasm of breast: Secondary | ICD-10-CM | POA: Diagnosis present

## 2023-08-02 IMAGING — MG MM DIGITAL SCREENING BILAT W/ TOMO AND CAD
6 of 12 series · 6 of 36 positions shown · non-contrast
Comparison: Previous exam(s).

CLINICAL DATA: Screening.

EXAM:
DIGITAL SCREENING BILATERAL MAMMOGRAM WITH TOMOSYNTHESIS AND CAD
TECHNIQUE: Bilateral screening digital craniocaudal and mediolateral oblique
mammograms were obtained. Bilateral screening digital breast
tomosynthesis was performed. The images were evaluated with
computer-aided detection.

[R CV synth-2D]
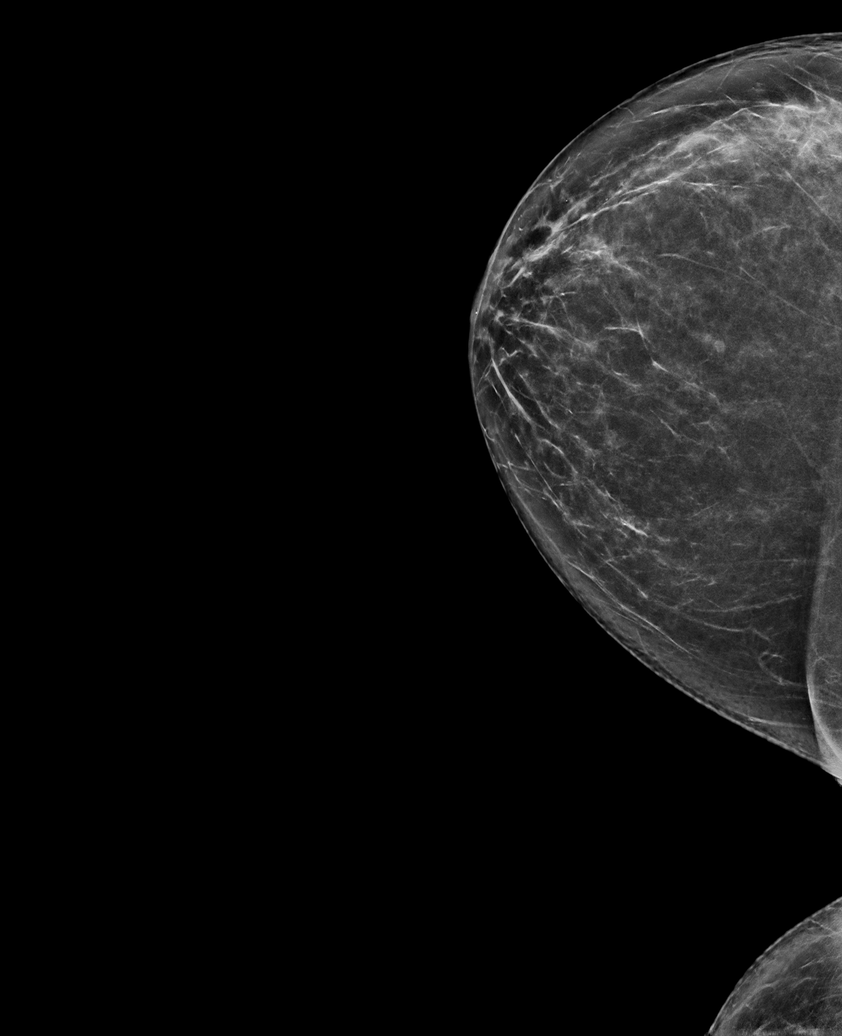

[L CC synth-2D]
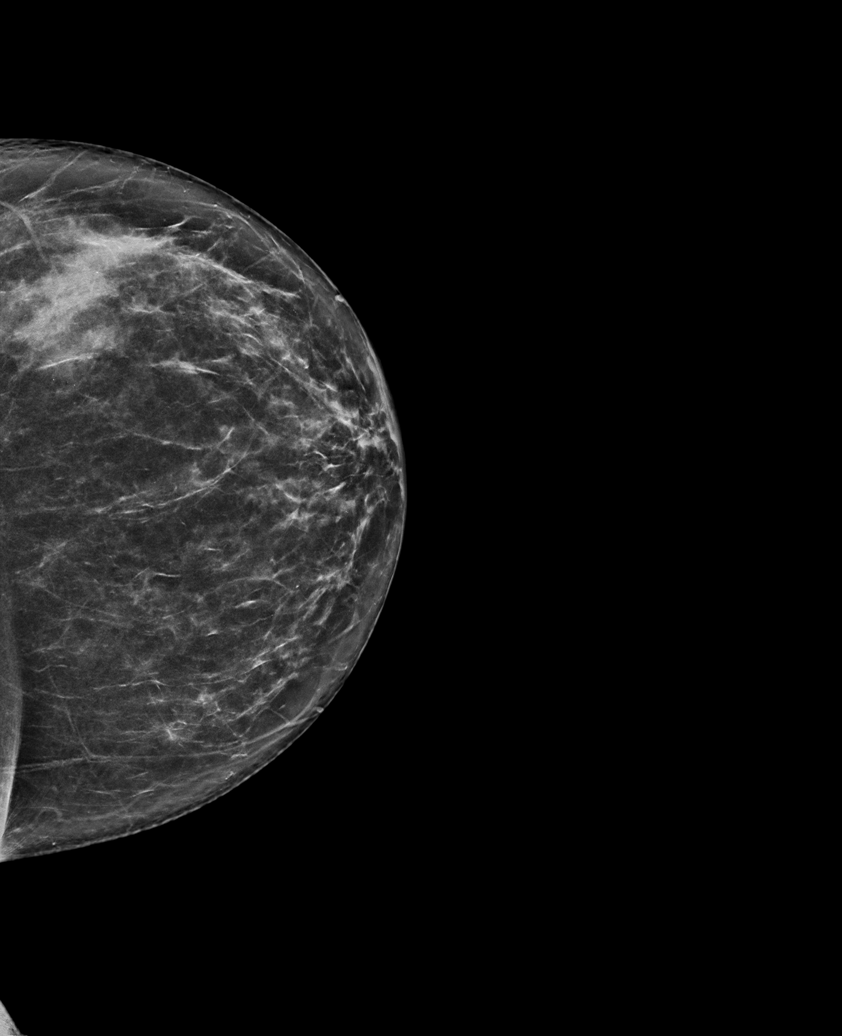

[L MLO synth-2D]
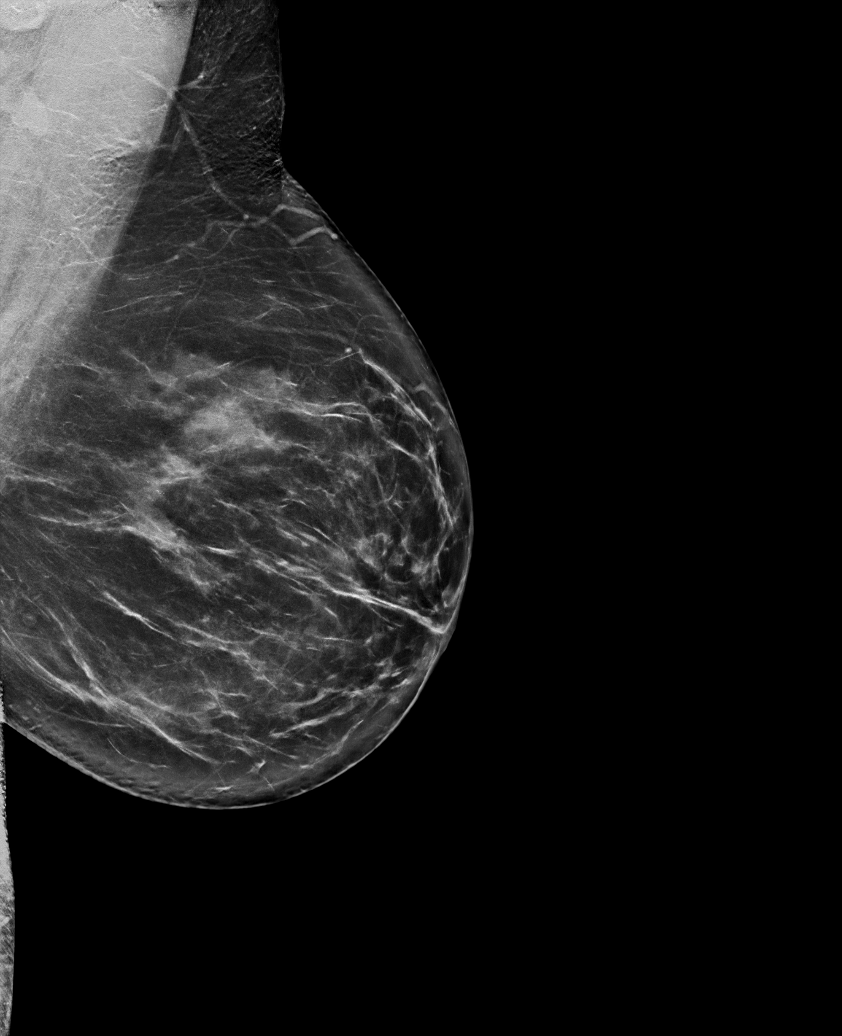

[R MLO synth-2D]
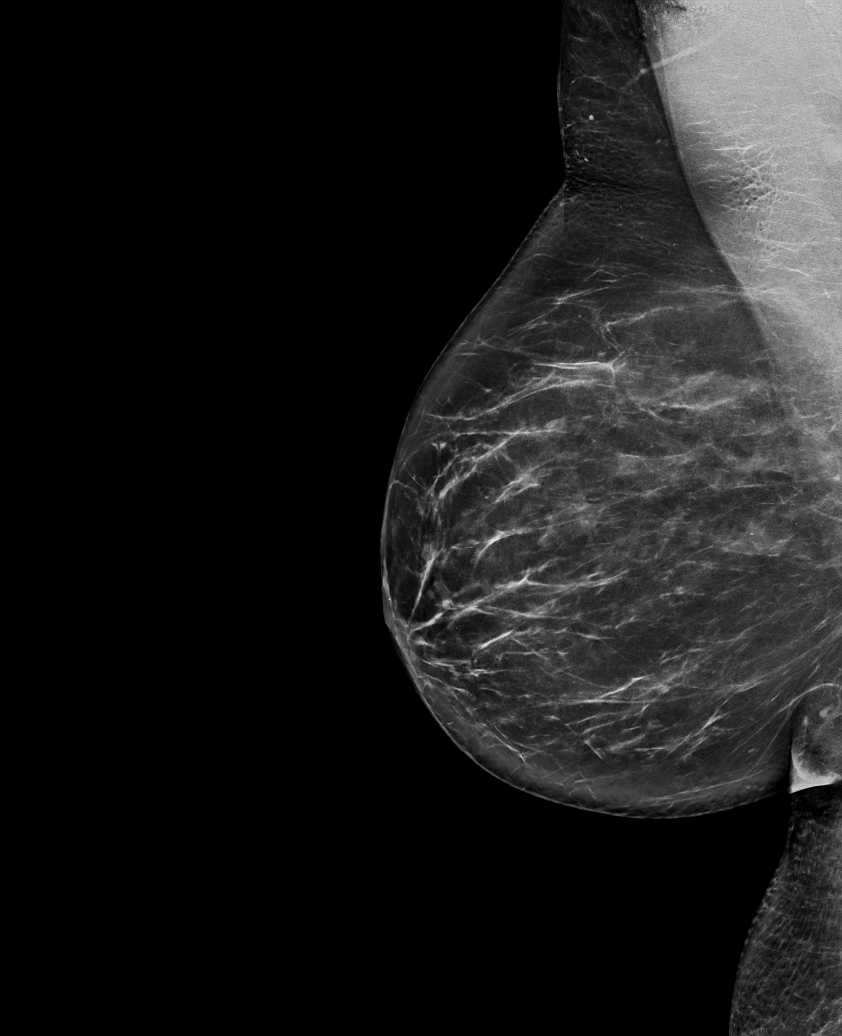

[L CV synth-2D]
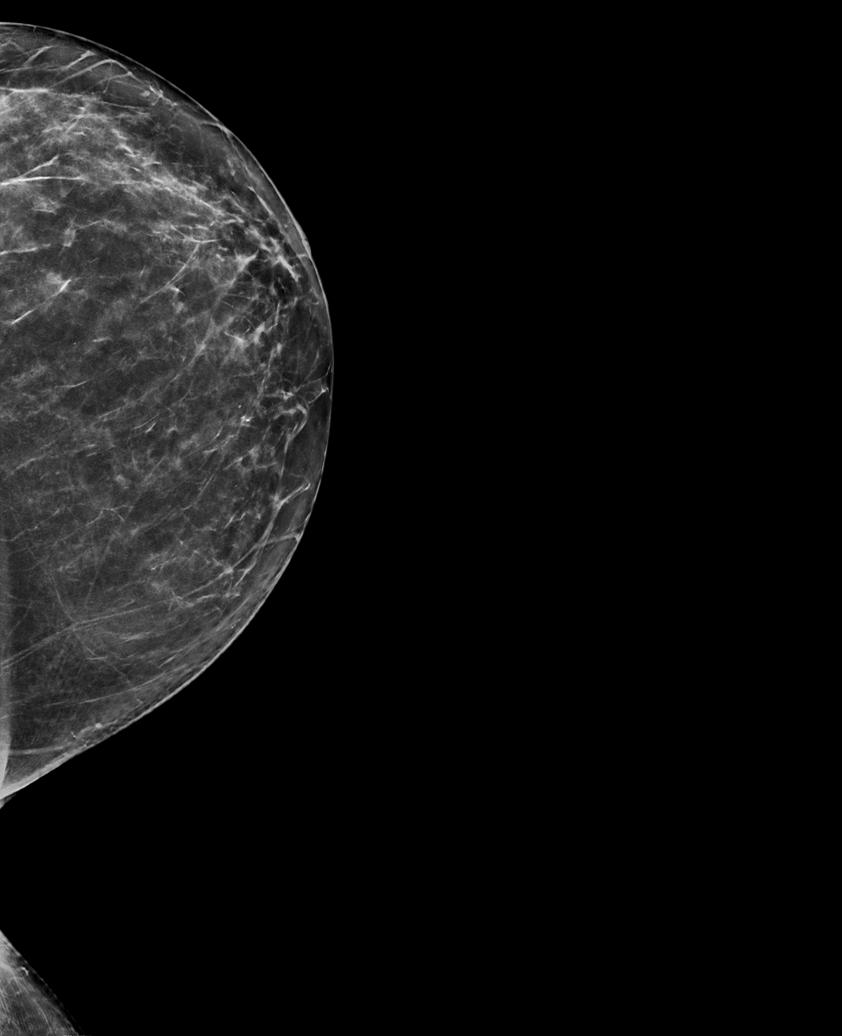

[R CC synth-2D]
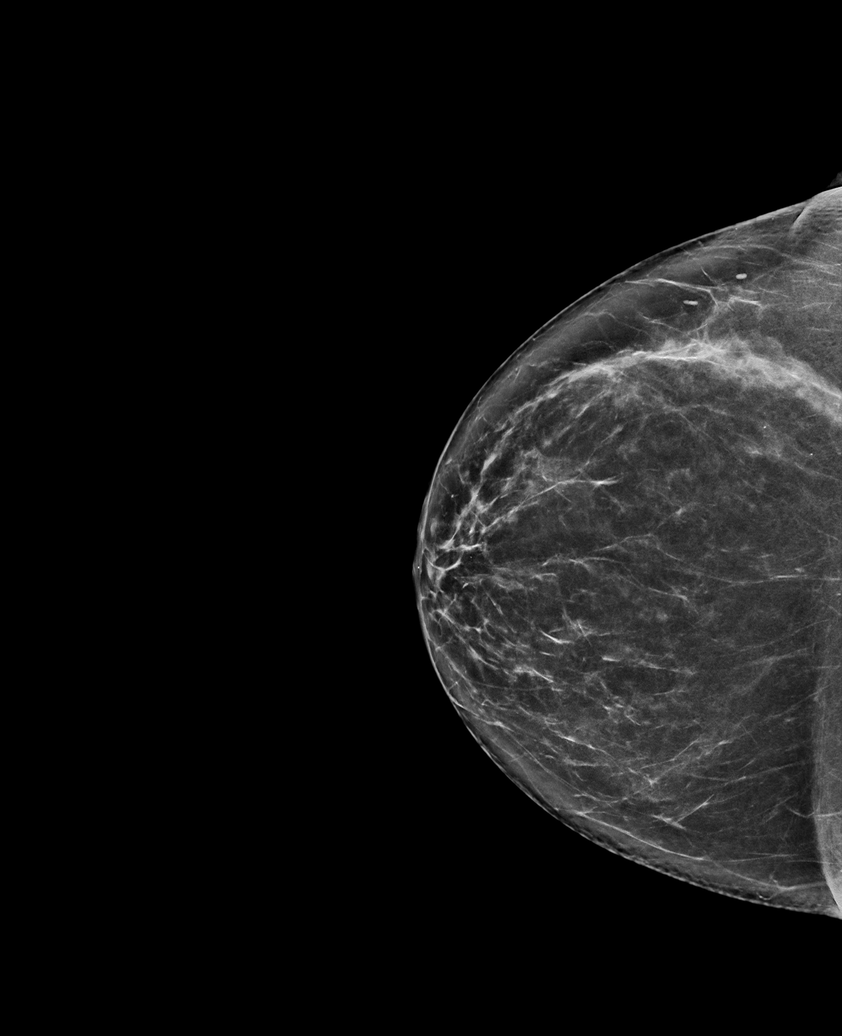

[6 of 36 positions shown; findings below may reference images not displayed]

ACR Breast Density Category b: There are scattered areas of
fibroglandular density.
FINDINGS: There are no findings suspicious for malignancy.
IMPRESSION: No mammographic evidence of malignancy. A result letter of this
screening mammogram will be mailed directly to the patient.

RECOMMENDATION:
Screening mammogram in one year. (Code:51-O-LD2)

BI-RADS CATEGORY  1: Negative.

## 2024-01-21 ENCOUNTER — Other Ambulatory Visit: Payer: Self-pay | Admitting: Obstetrics and Gynecology

## 2024-01-21 DIAGNOSIS — Z1231 Encounter for screening mammogram for malignant neoplasm of breast: Secondary | ICD-10-CM

## 2024-02-25 ENCOUNTER — Ambulatory Visit
Admission: RE | Admit: 2024-02-25 | Discharge: 2024-02-25 | Disposition: A | Discharge status: Home / Self Care | Payer: 59 | Source: Ambulatory Visit | Attending: Obstetrics and Gynecology | Admitting: Obstetrics and Gynecology

## 2024-02-25 DIAGNOSIS — Z1231 Encounter for screening mammogram for malignant neoplasm of breast: Secondary | ICD-10-CM | POA: Diagnosis present

## 2025-01-26 ENCOUNTER — Encounter
Admission: RE | Disposition: A | Discharge status: Home / Self Care | Payer: Self-pay | Source: Home / Self Care | Attending: Gastroenterology

## 2025-01-26 ENCOUNTER — Ambulatory Visit
Admission: RE | Admit: 2025-01-26 | Discharge: 2025-01-26 | Disposition: A | Source: Home / Self Care | Attending: Gastroenterology | Admitting: Gastroenterology

## 2025-01-26 ENCOUNTER — Ambulatory Visit: Admitting: Certified Registered"

## 2025-01-26 ENCOUNTER — Other Ambulatory Visit: Payer: Self-pay

## 2025-01-26 MED ORDER — LIDOCAINE HCL (PF) 2 % IJ SOLN
INTRAMUSCULAR | Status: AC
  Filled 2025-01-26: qty 5

## 2025-01-26 MED ORDER — PROPOFOL 1000 MG/100ML IV EMUL
INTRAVENOUS | Status: AC
  Filled 2025-01-26: qty 100

## 2025-01-26 MED ORDER — PROPOFOL 500 MG/50ML IV EMUL
INTRAVENOUS | Status: DC | PRN
  Administered 2025-01-26: 40 mg via INTRAVENOUS
  Administered 2025-01-26: 145 ug/kg/min via INTRAVENOUS

## 2025-01-26 MED ORDER — SODIUM CHLORIDE 0.9 % IV SOLN: INTRAVENOUS | Status: DC

## 2025-01-26 NOTE — Anesthesia Postprocedure Evaluation (Signed)
"   Anesthesia Post Note  Patient: Natasha Patel  Procedure(s) Performed: COLONOSCOPY BIOPSY, GI TRACT COLONOSCOPY, WITH POLYPECTOMY  Patient location during evaluation: PACU Anesthesia Type: General Level of consciousness: awake Pain management: satisfactory to patient Vital Signs Assessment: post-procedure vital signs reviewed and stable Cardiovascular status: stable Anesthetic complications: no   No notable events documented.   Last Vitals:  Vitals:   01/26/25 0850 01/26/25 0857  BP: 105/85 105/76  Pulse: 86 78  Resp: 14 13  Temp:    SpO2: 97% 100%    Last Pain:  Vitals:   01/26/25 0857  TempSrc:   PainSc: 0-No pain                 VAN STAVEREN,Tychelle Purkey      "

## 2025-01-26 NOTE — Interval H&P Note (Signed)
 History and Physical Interval Note:  01/26/2025 8:18 AM  Natasha Patel  has presented today for surgery, with the diagnosis of Gastroesophageal reflux disease, unspecified whether esophagitis present (K21.9) Colon cancer screening (Z12.11).  The various methods of treatment have been discussed with the patient and family. After consideration of risks, benefits and other options for treatment, the patient has consented to  Procedures: COLONOSCOPY (N/A) as a surgical intervention.  The patient's history has been reviewed, patient examined, no change in status, stable for surgery.  I have reviewed the patient's chart and labs.  Questions were answered to the patient's satisfaction.     Ole ONEIDA Schick  Ok to proceed with colonoscopy

## 2025-01-26 NOTE — Transfer of Care (Signed)
 Immediate Anesthesia Transfer of Care Note  Patient: Natasha Patel  Procedure(s) Performed: COLONOSCOPY BIOPSY, GI TRACT  Patient Location: PACU  Anesthesia Type:MAC  Level of Consciousness: awake, alert , and oriented  Airway & Oxygen Therapy: Patient Spontanous Breathing and Patient connected to nasal cannula oxygen  Post-op Assessment: Report given to RN and Post -op Vital signs reviewed and stable  Post vital signs: Reviewed and stable  Last Vitals:  Vitals Value Taken Time  BP 93/72 01/26/25 08:40  Temp 36.1 C 01/26/25 08:40  Pulse 105 01/26/25 08:41  Resp 18 01/26/25 08:41  SpO2 98 % 01/26/25 08:41  Vitals shown include unfiled device data.  Last Pain:  Vitals:   01/26/25 0840  TempSrc: Temporal  PainSc: 0-No pain         Complications: No notable events documented.

## 2025-01-26 NOTE — Op Note (Signed)
 [facility] Patient Name: Natasha Patel Procedure Date: 01/26/2025 7:08 AM MRN: [id number] Account #: [id number] Date of Birth: 07-22-1971 Admit Type: Outpatient Age: 54 Room: [facility] ENDO ROOM 3 Gender: Female Note Status: Finalized Instrument Name: Colon Scope [id number] Procedure:             Colonoscopy Indications:           Screening for colorectal malignant neoplasm Providers:             Ole Schick MD, MD Referring MD:          Ophelia Sage, MD (Referring MD) Medicines:             Monitored Anesthesia Care Complications:         No immediate complications. Estimated blood loss:                         Minimal. Procedure:             Pre-Anesthesia Assessment:                        - Prior to the procedure, a History and Physical was                         performed, and patient medications and allergies were                         reviewed. The patient is competent. The risks and                         benefits of the procedure and the sedation options and                         risks were discussed with the patient. All questions                         were answered and informed consent was obtained.                         Patient identification and proposed procedure were                         verified by the physician, the nurse, the                         anesthesiologist, the anesthetist and the technician                         in the endoscopy suite. Mental Status Examination:                         alert and oriented. Airway Examination: normal                         oropharyngeal airway and neck mobility. Respiratory                         Examination: clear to auscultation. CV Examination:  normal. Prophylactic Antibiotics: The patient does not                         require prophylactic antibiotics. Prior                         Anticoagulants: The patient has taken no anticoagulant                          or antiplatelet agents. ASA Grade Assessment: II - A                         patient with mild systemic disease. After reviewing                         the risks and benefits, the patient was deemed in                         satisfactory condition to undergo the procedure. The                         anesthesia plan was to use monitored anesthesia care                         (MAC). Immediately prior to administration of                         medications, the patient was re-assessed for adequacy                         to receive sedatives. The heart rate, respiratory                         rate, oxygen saturations, blood pressure, adequacy of                         pulmonary ventilation, and response to care were                         monitored throughout the procedure. The physical                         status of the patient was re-assessed after the                         procedure.                        After obtaining informed consent, the colonoscope was                         passed under direct vision. Throughout the procedure,                         the patient's blood pressure, pulse, and oxygen                         saturations were monitored continuously. The  Colonoscope was introduced through the anus and                         advanced to the the terminal ileum, with                         identification of the appendiceal orifice and IC                         valve. The colonoscopy was performed without                         difficulty. The patient tolerated the procedure well.                         The quality of the bowel preparation was good. The                         terminal ileum, ileocecal valve, appendiceal orifice,                         and rectum were photographed. Findings:      The perianal and digital rectal examinations were normal.      The terminal ileum appeared normal.      Scattered  small-mouthed diverticula were found in the sigmoid colon,       descending colon, transverse colon and ascending colon.      A 7 mm polyp was found in the sigmoid colon. The polyp was       semi-pedunculated. The polyp was removed with a cold snare. Resection       and retrieval were complete. Estimated blood loss was minimal.      A 3 mm polyp was found in the rectum. The polyp was sessile. The polyp       was removed with a cold snare. Resection and retrieval were complete.       Estimated blood loss was minimal.      Internal hemorrhoids were found during retroflexion. The hemorrhoids       were Grade I (internal hemorrhoids that do not prolapse).      The exam was otherwise without abnormality on direct and retroflexion       views. Impression:            - The examined portion of the ileum was normal.                        - Diverticulosis in the sigmoid colon, in the                         descending colon, in the transverse colon and in the                         ascending colon.                        - One 7 mm polyp in the sigmoid colon, removed with a                         cold snare. Resected and retrieved.                        -  One 3 mm polyp in the rectum, removed with a cold                         snare. Resected and retrieved.                        - Internal hemorrhoids.                        - The examination was otherwise normal on direct and                         retroflexion views. Recommendation:        - Discharge patient to home.                        - Resume previous diet.                        - Continue present medications.                        - Await pathology results.                        - Repeat colonoscopy date to be determined after                         pending pathology results are reviewed for                         surveillance.                        - Return to referring physician as previously                          scheduled. Procedure Code(s):     --- Professional ---                        [phone], Colonoscopy, flexible; with removal of                         tumor(s), polyp(s), or other lesion(s) by snare                         technique Diagnosis Code(s):     --- Professional ---                        Z12.11, Encounter for screening for malignant neoplasm                         of colon                        K64.0, First degree hemorrhoids                        D12.5, Benign neoplasm of sigmoid colon                        D12.8, Benign neoplasm  of rectum                        K57.30, Diverticulosis of large intestine without                         perforation or abscess without bleeding CPT copyright 2022 [employer]. All rights reserved. The codes documented in this report are preliminary and upon coder review may  be revised to meet current compliance requirements. Ole Schick MD, MD 01/26/2025 8:43:30 AM Number of Addenda: 0 Note Initiated On: 01/26/2025 7:08 AM Scope Withdrawal Time: 0 hours 9 minutes 46 seconds  Total Procedure Duration: 0 hours 13 minutes 5 seconds  Estimated Blood Loss:  Estimated blood loss was minimal.      [facility]

## 2025-01-26 NOTE — H&P (Signed)
 Outpatient short stay form Pre-procedure 01/26/2025  Ole ONEIDA Schick, MD  Primary Physician: Fernande Ophelia JINNY DOUGLAS, MD  Reason for visit:  Screening  History of present illness:    54 y/o lady with history of hypertension and DM II here for index screening colonoscopy. No blood thinners. No significant abdominal surgeries. No family history of GI malignancies.   Current Medications[1]  Medications Prior to Admission  Medication Sig Dispense Refill Last Dose/Taking   lisinopril (PRINIVIL,ZESTRIL) 10 MG tablet Take 10 mg by mouth daily.  5 01/26/2025 at  6:00 AM   Semaglutide (RYBELSUS) 14 MG TABS Take by mouth.   01/23/2025   Clindamycin Phos-Benzoyl Perox (ONEXTON) 1.2-3.75 % GEL Apply 1 application topically at bedtime.      CONTOUR NEXT TEST test strip USE 3 (THREE) TIMES DAILY USE AS INSTRUCTED.  12    ELDERBERRY PO Take 1 tablet by mouth daily.      ibuprofen (ADVIL,MOTRIN) 100 MG tablet Take 100 mg by mouth every 6 (six) hours as needed for fever.      ranitidine (ZANTAC) 150 MG tablet Take 150 mg by mouth daily as needed for heartburn.   01/23/2025     Allergies[2]   Past Medical History:  Diagnosis Date   Diabetes mellitus without complication (HCC)    Diverticulosis    Hemorrhoid    Kidney stones    Rosacea     Review of systems:  Otherwise negative.    Physical Exam  Gen: Alert, oriented. Appears stated age.  HEENT: PERRLA. Lungs: No respiratory distress CV: RRR Abd: soft, benign, no masses Ext: No edema    Planned procedures: Proceed with colonoscopy. The patient understands the nature of the planned procedure, indications, risks, alternatives and potential complications including but not limited to bleeding, infection, perforation, damage to internal organs and possible oversedation/side effects from anesthesia. The patient agrees and gives consent to proceed.  Please refer to procedure notes for findings, recommendations and patient disposition/instructions.      Ole ONEIDA Schick, MD [facility]         [1]  Current Facility-Administered Medications:    0.9 %  sodium chloride  infusion, , Intravenous, Continuous, Marleena Shubert, Ole ONEIDA, MD, Last Rate: 20 mL/hr at 01/26/25 0737, New Bag at 01/26/25 0737 [2]  Allergies Allergen Reactions   Pineapple Swelling   Tetracyclines & Related Swelling    joints   Amoxil [Amoxicillin] Rash   Protonix [Pantoprazole] Rash   Sulfa Antibiotics Rash

## 2025-01-26 NOTE — Anesthesia Preprocedure Evaluation (Signed)
"                                    Anesthesia Evaluation  Patient identified by MRN, date of birth, ID band Patient awake    Reviewed: Allergy & Precautions, NPO status , Patient's Chart, lab work & pertinent test results  Airway Mallampati: II  TM Distance: >3 FB Neck ROM: Full    Dental  (+) Teeth Intact   Pulmonary neg pulmonary ROS, former smoker   Pulmonary exam normal breath sounds clear to auscultation       Cardiovascular Exercise Tolerance: Good hypertension, Pt. on medications negative cardio ROS Normal cardiovascular exam Rhythm:Regular Rate:Normal     Neuro/Psych negative neurological ROS  negative psych ROS   GI/Hepatic negative GI ROS, Neg liver ROS,GERD  Medicated,,  Endo/Other  negative endocrine ROSdiabetes, Type 2, Oral Hypoglycemic Agents    Renal/GU negative Renal ROS  negative genitourinary   Musculoskeletal negative musculoskeletal ROS (+)    Abdominal Normal abdominal exam  (+)   Peds negative pediatric ROS (+)  Hematology negative hematology ROS (+)   Anesthesia Other Findings Past Medical History: No date: Diabetes mellitus without complication (HCC) No date: Diverticulosis No date: Hemorrhoid No date: Kidney stones No date: Rosacea  Past Surgical History: No date: BLADDER SURGERY     Comment:  scope 2006: CHOLECYSTECTOMY No date: LITHOTRIPSY     Reproductive/Obstetrics negative OB ROS                              Anesthesia Physical Anesthesia Plan  ASA: 2  Anesthesia Plan: General   Post-op Pain Management:    Induction: Intravenous  PONV Risk Score and Plan: Propofol  infusion and TIVA  Airway Management Planned: Natural Airway and Nasal Cannula  Additional Equipment:   Intra-op Plan:   Post-operative Plan:   Informed Consent: I have reviewed the patients History and Physical, chart, labs and discussed the procedure including the risks, benefits and alternatives for  the proposed anesthesia with the patient or authorized representative who has indicated his/her understanding and acceptance.     Dental Advisory Given  Plan Discussed with: CRNA  Anesthesia Plan Comments:         Anesthesia Quick Evaluation  "
